# Patient Record
Sex: Female | Born: 2000 | Race: Black or African American | Hispanic: No | Marital: Single | Smoking: Never smoker
Health system: Southern US, Community
[De-identification: ages and names within clinical notes are randomized; demographics above are authoritative.]

---
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
## 2020-01-18 ENCOUNTER — Other Ambulatory Visit: Payer: Self-pay

## 2020-01-18 ENCOUNTER — Ambulatory Visit
Admission: EM | Admit: 2020-01-18 | Discharge: 2020-01-18 | Disposition: A | Discharge status: Home / Self Care | Payer: 59 | Attending: Family Medicine | Admitting: Family Medicine

## 2020-01-18 DIAGNOSIS — R000 Tachycardia, unspecified: Secondary | ICD-10-CM

## 2020-01-18 DIAGNOSIS — R519 Headache, unspecified: Secondary | ICD-10-CM

## 2020-01-18 DIAGNOSIS — R52 Pain, unspecified: Secondary | ICD-10-CM

## 2020-01-18 DIAGNOSIS — B349 Viral infection, unspecified: Secondary | ICD-10-CM | POA: Diagnosis not present

## 2020-01-18 DIAGNOSIS — R5383 Other fatigue: Secondary | ICD-10-CM

## 2020-01-18 DIAGNOSIS — R059 Cough, unspecified: Secondary | ICD-10-CM

## 2020-01-18 DIAGNOSIS — Z20822 Contact with and (suspected) exposure to covid-19: Secondary | ICD-10-CM

## 2020-01-18 DIAGNOSIS — R0982 Postnasal drip: Secondary | ICD-10-CM

## 2020-01-18 NOTE — ED Triage Notes (Signed)
Pt presents with a cough and post nasal drainage x 3 weeks. Reports she was taking medications for bronchitis. Reports she was exposed to covid and flu in the last 2 weeks. Reports waking up with body aches, fever, and congestion x 2 days.

## 2020-01-18 NOTE — ED Provider Notes (Incomplete)
[facility]   [id number] 01/18/20 Arrival Time: 1258   CC: COVID symptoms  SUBJECTIVE: History from: {Patient/Family/Caregiver:109081}.  Lisa Young is a 20 y.o. female who presents with abrupt onset of nasal congestion, PND, and persistent dry cough for *** . Denies sick exposure to COVID, flu or strep. Denies recent travel. Has *** history of Covid. Has *** completed Covid vaccines. Has not taken OTC medications for this. There are no aggravating or alleviating factors. Denies previous symptoms in the past. Denies fever, chills, fatigue, sinus pain, rhinorrhea, sore throat, SOB, wheezing, chest pain, nausea, changes in bowel or bladder habits.    ROS: As per HPI.  All other pertinent ROS negative.     History reviewed. No pertinent past medical history. History reviewed. No pertinent surgical history. Allergies  Allergen Reactions  . Guaifenesin & Derivatives Nausea And Vomiting   No current facility-administered medications on file prior to encounter.   No current outpatient medications on file prior to encounter.   Social History   Socioeconomic History  . Marital status: Single    Spouse name: Not on file  . Number of children: Not on file  . Years of education: Not on file  . Highest education level: Not on file  Occupational History  . Not on file  Tobacco Use  . Smoking status: Never Smoker  . Smokeless tobacco: Never Used  Substance and Sexual Activity  . Alcohol use: No  . Drug use: No  . Sexual activity: Not on file  Other Topics Concern  . Not on file  Social History Narrative  . Not on file   Social Determinants of Health   Financial Resource Strain: Not on file  Food Insecurity: Not on file  Transportation Needs: Not on file  Physical Activity: Not on file  Stress: Not on file  Social Connections: Not on file  Intimate Partner Violence: Not on file   Family History  Problem Relation Age of Onset  . Healthy Mother   . Healthy  Father     OBJECTIVE:  Vitals:   01/18/20 1335  BP: 102/71  Pulse: (!) 110  Resp: 19  Temp: 98.5 F (36.9 C)  SpO2: 98%     General appearance: alert; appears fatigued, but nontoxic; speaking in full sentences and tolerating own secretions HEENT: NCAT; Ears: EACs clear, TMs pearly gray; Eyes: PERRL.  EOM grossly intact. Sinuses: nontender; Nose: nares patent without rhinorrhea, Throat: oropharynx erythematous, cobblestoning present, tonsils non erythematous or enlarged, uvula midline  Neck: supple without LAD Lungs: unlabored respirations, symmetrical air entry; cough: {Present / absent with mmm:[id number]}; no respiratory distress; CTAB Heart: regular rate and rhythm.  Radial pulses 2+ symmetrical bilaterally Skin: warm and dry Psychological: alert and cooperative; normal mood and affect  LABS:  No results found for this or any previous visit (from the past 24 hour(s)).   ASSESSMENT & PLAN:  1. Viral illness   2. Exposure to confirmed case of COVID-19   3. Tachycardia   4. Cough   5. Post-nasal drip   6. Other fatigue   7. Body aches   8. Nonintractable headache, unspecified chronicity pattern, unspecified headache type     No orders of the defined types were placed in this encounter.   Continue supportive care at home COVID and flu testing ordered.  It will take between 1-2 days for test results.  Someone will contact you regarding abnormal results.   ***Work   School note provided Patient should remain in  quarantine until they have received Covid results.  If negative you may resume normal activities (go back to work/school) while practicing hand hygiene, social distance, and mask wearing.  If positive, patient should remain in quarantine for 10 days from symptom onset AND greater than 72 hours after symptoms resolution (absence of fever without the use of fever-reducing medication and improvement in respiratory symptoms), whichever is longer Get plenty of rest and push  fluids Use OTC zyrtec for nasal congestion, runny nose, and/or sore throat Use OTC flonase for nasal congestion and runny nose Use medications daily for symptom relief Use OTC medications like ibuprofen or tylenol as needed fever or pain Call or go to the ED if you have any new or worsening symptoms such as fever, worsening cough, shortness of breath, chest tightness, chest pain, turning blue, changes in mental status.  Reviewed expectations re: course of current medical issues. Questions answered. Outlined signs and symptoms indicating need for more acute intervention. Patient verbalized understanding. After Visit Summary given.

## 2020-01-18 NOTE — Discharge Instructions (Signed)
May use OTC cough syrup as needed  Your COVID and Flu tests are pending.  You should self quarantine until the test results are back.    Take Tylenol or ibuprofen as needed for fever or discomfort.  Rest and keep yourself hydrated.    Follow-up with your primary care provider if your symptoms are not improving.

## 2020-01-19 LAB — COVID-19, FLU A+B NAA
Influenza A, NAA: NOT DETECTED
Influenza B, NAA: NOT DETECTED
SARS-CoV-2, NAA: DETECTED — AB

## 2020-01-21 NOTE — ED Provider Notes (Signed)
[facility]   [id number] 01/18/20 Arrival Time: 1258   CC: COVID symptoms  SUBJECTIVE: History from: patient.  Lisa Young is a 20 y.o. female who presents with cough, nasal congestion, post nasal drip for the last 3 weeks. Reports body aches, fever x 2 days. Reports sick exposure to flu. Denies recent travel. Has negative history of Covid. Has not completed Covid vaccines. Has not taken OTC medications for this. There are no aggravating or alleviating factors. Denies previous symptoms in the past. Denies chills, fatigue, sinus pain, rhinorrhea, sore throat, SOB, wheezing, chest pain, nausea, changes in bowel or bladder habits.    ROS: As per HPI.  All other pertinent ROS negative.     History reviewed. No pertinent past medical history. History reviewed. No pertinent surgical history. Allergies  Allergen Reactions  . Guaifenesin & Derivatives Nausea And Vomiting   No current facility-administered medications on file prior to encounter.   No current outpatient medications on file prior to encounter.   Social History   Socioeconomic History  . Marital status: Single    Spouse name: Not on file  . Number of children: Not on file  . Years of education: Not on file  . Highest education level: Not on file  Occupational History  . Not on file  Tobacco Use  . Smoking status: Never Smoker  . Smokeless tobacco: Never Used  Substance and Sexual Activity  . Alcohol use: No  . Drug use: No  . Sexual activity: Not on file  Other Topics Concern  . Not on file  Social History Narrative  . Not on file   Social Determinants of Health   Financial Resource Strain: Not on file  Food Insecurity: Not on file  Transportation Needs: Not on file  Physical Activity: Not on file  Stress: Not on file  Social Connections: Not on file  Intimate Partner Violence: Not on file   Family History  Problem Relation Age of Onset  . Healthy Mother   . Healthy Father      OBJECTIVE:  Vitals:   01/18/20 1335  BP: 102/71  Pulse: (!) 110  Resp: 19  Temp: 98.5 F (36.9 C)  SpO2: 98%     General appearance: alert; appears fatigued, but nontoxic; speaking in full sentences and tolerating own secretions HEENT: NCAT; Ears: EACs clear, TMs pearly gray; Eyes: PERRL.  EOM grossly intact. Sinuses: nontender; Nose: nares patent with clear rhinorrhea, Throat: oropharynx erythematous, cobblestoning present, tonsils non erythematous or enlarged, uvula midline  Neck: supple without LAD Lungs: unlabored respirations, symmetrical air entry; cough: absent; no respiratory distress; CTAB Heart: regular rate and rhythm.  Radial pulses 2+ symmetrical bilaterally Skin: warm and dry Psychological: alert and cooperative; normal mood and affect  LABS:  No results found for this or any previous visit (from the past 24 hour(s)).   ASSESSMENT & PLAN:  1. Viral illness   2. Exposure to confirmed case of COVID-19   3. Tachycardia   4. Cough   5. Post-nasal drip   6. Other fatigue   7. Body aches   8. Nonintractable headache, unspecified chronicity pattern, unspecified headache type      Continue supportive care at home COVID and flu testing ordered.  It will take between 2-3 days for test results. Someone will contact you regarding abnormal results.   Work note provided Patient should remain in quarantine until they have received Covid results.  If negative you may resume normal activities (go back to  work/school) while practicing hand hygiene, social distance, and mask wearing.  If positive, patient should remain in quarantine for at least 5 days from symptom onset AND greater than 72 hours after symptoms resolution (absence of fever without the use of fever-reducing medication and improvement in respiratory symptoms), whichever is longer Get plenty of rest and push fluids Use OTC zyrtec for nasal congestion, runny nose, and/or sore throat Use OTC flonase for nasal  congestion and runny nose Use medications daily for symptom relief Use OTC medications like ibuprofen or tylenol as needed fever or pain Call or go to the ED if you have any new or worsening symptoms such as fever, worsening cough, shortness of breath, chest tightness, chest pain, turning blue, changes in mental status.  Reviewed expectations re: course of current medical issues. Questions answered. Outlined signs and symptoms indicating need for more acute intervention. Patient verbalized understanding. After Visit Summary given.         Moshe Cipro, NP 01/21/20 1328

## 2020-01-23 MED FILL — VYLIBRA 0.25-35 MG-MCG TABS: 0.25-35 | 84 days supply | Qty: 84 | Fill #2

## 2020-04-04 ENCOUNTER — Other Ambulatory Visit (HOSPITAL_BASED_OUTPATIENT_CLINIC_OR_DEPARTMENT_OTHER): Payer: Self-pay

## 2020-04-12 ENCOUNTER — Other Ambulatory Visit (HOSPITAL_BASED_OUTPATIENT_CLINIC_OR_DEPARTMENT_OTHER): Payer: Self-pay

## 2020-05-05 ENCOUNTER — Other Ambulatory Visit (HOSPITAL_COMMUNITY): Payer: Self-pay

## 2020-05-05 MED FILL — Norgestimate & Ethinyl Estradiol Tab 0.25 MG-35 MCG: ORAL | 84 days supply | Qty: 84 | Fill #0 | Status: AC

## 2020-07-30 ENCOUNTER — Other Ambulatory Visit (HOSPITAL_COMMUNITY): Payer: Self-pay

## 2020-07-31 ENCOUNTER — Other Ambulatory Visit (HOSPITAL_COMMUNITY): Payer: Self-pay

## 2020-08-01 ENCOUNTER — Other Ambulatory Visit (HOSPITAL_COMMUNITY): Payer: Self-pay

## 2020-08-03 ENCOUNTER — Other Ambulatory Visit (HOSPITAL_COMMUNITY): Payer: Self-pay

## 2020-08-04 ENCOUNTER — Other Ambulatory Visit (HOSPITAL_COMMUNITY): Payer: Self-pay

## 2020-08-07 ENCOUNTER — Other Ambulatory Visit (HOSPITAL_COMMUNITY): Payer: Self-pay

## 2020-08-08 ENCOUNTER — Other Ambulatory Visit (HOSPITAL_COMMUNITY): Payer: Self-pay

## 2020-08-09 ENCOUNTER — Other Ambulatory Visit (HOSPITAL_COMMUNITY): Payer: Self-pay

## 2020-08-10 ENCOUNTER — Other Ambulatory Visit (HOSPITAL_COMMUNITY): Payer: Self-pay

## 2020-08-10 MED ORDER — NORGESTIMATE-ETH ESTRADIOL 0.25-35 MG-MCG PO TABS
1.0000 | ORAL_TABLET | Freq: Every day | ORAL | 0 refills | Status: AC
  Filled 2020-08-10: qty 84, 84d supply, fill #0

## 2021-01-02 ENCOUNTER — Other Ambulatory Visit (HOSPITAL_COMMUNITY): Payer: Self-pay

## 2021-01-02 MED ORDER — NORGESTIMATE-ETH ESTRADIOL 0.25-35 MG-MCG PO TABS
1.0000 | ORAL_TABLET | Freq: Every day | ORAL | 1 refills | Status: AC
  Filled 2021-01-02: qty 84, 84d supply, fill #0
  Filled 2021-05-18: qty 84, 84d supply, fill #1

## 2021-01-21 DIAGNOSIS — M25561 Pain in right knee: Secondary | ICD-10-CM | POA: Diagnosis not present

## 2021-05-18 ENCOUNTER — Other Ambulatory Visit (HOSPITAL_COMMUNITY): Payer: Self-pay

## 2021-05-18 DIAGNOSIS — L0291 Cutaneous abscess, unspecified: Secondary | ICD-10-CM | POA: Diagnosis not present

## 2021-11-22 DIAGNOSIS — J019 Acute sinusitis, unspecified: Secondary | ICD-10-CM | POA: Diagnosis not present

## 2022-01-16 ENCOUNTER — Other Ambulatory Visit (HOSPITAL_COMMUNITY): Payer: Self-pay

## 2022-03-05 ENCOUNTER — Other Ambulatory Visit (HOSPITAL_COMMUNITY): Payer: Self-pay

## 2023-03-14 DIAGNOSIS — R6889 Other general symptoms and signs: Secondary | ICD-10-CM | POA: Diagnosis not present
